# Patient Record
Sex: Male | Born: 1990 | Hispanic: Yes | Marital: Married | Smoking: Never smoker
Health system: Southern US, Community
[De-identification: ages and names within clinical notes are randomized; demographics above are authoritative.]

## PROBLEM LIST (undated history)

## (undated) DIAGNOSIS — J45909 Unspecified asthma, uncomplicated: Secondary | ICD-10-CM

---

## 2018-11-10 ENCOUNTER — Encounter (HOSPITAL_COMMUNITY): Payer: Self-pay

## 2018-11-10 ENCOUNTER — Ambulatory Visit (HOSPITAL_COMMUNITY)
Admission: EM | Admit: 2018-11-10 | Discharge: 2018-11-10 | Disposition: A | Discharge status: Home / Self Care | Payer: Self-pay | Attending: Emergency Medicine | Admitting: Emergency Medicine

## 2018-11-10 DIAGNOSIS — Z202 Contact with and (suspected) exposure to infections with a predominantly sexual mode of transmission: Secondary | ICD-10-CM | POA: Insufficient documentation

## 2018-11-10 DIAGNOSIS — R300 Dysuria: Secondary | ICD-10-CM | POA: Insufficient documentation

## 2018-11-10 DIAGNOSIS — Z7251 High risk heterosexual behavior: Secondary | ICD-10-CM | POA: Insufficient documentation

## 2018-11-10 DIAGNOSIS — R369 Urethral discharge, unspecified: Secondary | ICD-10-CM

## 2018-11-10 DIAGNOSIS — R319 Hematuria, unspecified: Secondary | ICD-10-CM

## 2018-11-10 LAB — POCT URINALYSIS DIP (DEVICE)
Glucose, UA: NEGATIVE mg/dL
Ketones, ur: NEGATIVE mg/dL
Leukocytes,Ua: NEGATIVE
Nitrite: NEGATIVE
Protein, ur: 100 mg/dL — AB
Specific Gravity, Urine: >=1.03 (ref 1.005–1.030)
Urobilinogen, UA: 0.2 mg/dL (ref 0.0–1.0)
pH: 5.5 (ref 5.0–8.0)

## 2018-11-10 MED ORDER — CEFTRIAXONE SODIUM 250 MG IJ SOLR
250.0000 mg | Freq: Once | INTRAMUSCULAR | Status: AC
  Administered 2018-11-10: 250 mg via INTRAMUSCULAR

## 2018-11-10 MED ORDER — CEFTRIAXONE SODIUM 250 MG IJ SOLR
INTRAMUSCULAR | Status: AC
  Filled 2018-11-10: qty 250

## 2018-11-10 MED ORDER — AZITHROMYCIN 250 MG PO TABS
1000.0000 mg | ORAL_TABLET | Freq: Once | ORAL | Status: AC
  Administered 2018-11-10: 1000 mg via ORAL

## 2018-11-10 MED ORDER — AZITHROMYCIN 250 MG PO TABS
ORAL_TABLET | ORAL | Status: AC
  Filled 2018-11-10: qty 4

## 2018-11-10 NOTE — ED Provider Notes (Signed)
[facility]    CSN: [id number] Arrival date & time: 11/10/18  2542     History   Chief Complaint Chief Complaint  Patient presents with  . Urinary Tract Infection    HPI Jesse Madden is a 28 y.o. male.   Patient presents with dysuria and hematuria x1 day.  He also reports clear penile discharge.  He denies testicular scrotal pain.  He denies fever, chills, abdominal pain, back pain.  He report he is sexually active with one partner.   The history is provided by the patient.    History reviewed. No pertinent past medical history.  There are no active problems to display for this patient.   History reviewed. No pertinent surgical history.     Home Medications    Prior to Admission medications   Not on File    Family History Family History  Problem Relation Age of Onset  . Asthma Mother   . Diabetes Mother   . Healthy Father     Social History Social History   Tobacco Use  . Smoking status: Never Smoker  . Smokeless tobacco: Never Used  Substance Use Topics  . Alcohol use: Yes  . Drug use: Not on file     Allergies   Patient has no known allergies.   Review of Systems Review of Systems  Constitutional: Negative for chills and fever.  HENT: Negative for ear pain and sore throat.   Eyes: Negative for pain and visual disturbance.  Respiratory: Negative for cough and shortness of breath.   Cardiovascular: Negative for chest pain and palpitations.  Gastrointestinal: Negative for abdominal pain and vomiting.  Genitourinary: Positive for discharge, dysuria and hematuria. Negative for flank pain and testicular pain.  Musculoskeletal: Negative for arthralgias and back pain.  Skin: Negative for color change and rash.  Neurological: Negative for seizures and syncope.  All other systems reviewed and are negative.    Physical Exam Triage Vital Signs ED Triage Vitals  Enc Vitals Group     BP 11/10/18 1926 (!) 143/93     Pulse Rate  11/10/18 1926 70     Resp 11/10/18 1926 17     Temp 11/10/18 1926 98 F (36.7 C)     Temp Source 11/10/18 1926 Oral     SpO2 11/10/18 1926 100 %     Weight --      Height --      Head Circumference --      Peak Flow --      Pain Score 11/10/18 1923 0     Pain Loc --      Pain Edu? --      Excl. in [place]? --    No data found.  Updated Vital Signs BP (!) 143/93 (BP Location: Right Arm)   Pulse 70   Temp 98 F (36.7 C) (Oral)   Resp 17   SpO2 100%   Visual Acuity Right Eye Distance:   Left Eye Distance:   Bilateral Distance:    Right Eye Near:   Left Eye Near:    Bilateral Near:     Physical Exam Vitals signs and nursing note reviewed.  Constitutional:      Appearance: He is well-developed.  HENT:     Head: Normocephalic and atraumatic.  Eyes:     Conjunctiva/sclera: Conjunctivae normal.  Neck:     Musculoskeletal: Neck supple.  Cardiovascular:     Rate and Rhythm: Normal rate and regular rhythm.  Heart sounds: No murmur.  Pulmonary:     Effort: Pulmonary effort is normal. No respiratory distress.     Breath sounds: Normal breath sounds.  Abdominal:     Palpations: Abdomen is soft.     Tenderness: There is no abdominal tenderness. There is no right CVA tenderness, left CVA tenderness, guarding or rebound.  Genitourinary:    Penis: Normal.      Scrotum/Testes: Normal.        Right: Tenderness not present.        Left: Tenderness not present.     Epididymis:     Right: Normal.     Left: Normal.     Comments: Clear penile discharge.  Skin:    General: Skin is warm and dry.  Neurological:     Mental Status: He is alert.      UC Treatments / Results  Labs (all labs ordered are listed, but only abnormal results are displayed) Labs Reviewed  POCT URINALYSIS DIP (DEVICE) - Abnormal; Notable for the following components:      Result Value   Bilirubin Urine SMALL (*)    Hgb urine dipstick LARGE (*)    Protein, ur 100 (*)    All other components within  normal limits  URINE CULTURE  URINE CYTOLOGY ANCILLARY ONLY    EKG   Radiology No results found.  Procedures Procedures (including critical care time)  Medications Ordered in UC Medications  azithromycin (ZITHROMAX) tablet 1,000 mg (has no administration in time range)  cefTRIAXone (ROCEPHIN) injection 250 mg (has no administration in time range)  azithromycin (ZITHROMAX) 250 MG tablet (has no administration in time range)  cefTRIAXone (ROCEPHIN) 250 MG injection (has no administration in time range)    Initial Impression / Assessment and Plan / UC Course  I have reviewed the triage vital signs and the nursing notes.  Pertinent labs & imaging results that were available during my care of the patient were reviewed by me and considered in my medical decision making (see chart for details).   Unprotected sex and potential exposure to STD.  Dysuria.  Treated today with Rocephin and Zithromax.  Urine culture sent.  Urine for gonorrhea, chlamydia, trichomonas also sent.  Discussed with patient that if his STD tests come back positive, his sexual partner will need to be treated.  Discussed that while his STD test are pending, he should use condoms or abstain from sex.  Discussed that he should return here or go to the emergency department if he develops testicular pain, scrotal pain, fever, chills, abdominal pain, back pain, or other concerning symptoms.     Final Clinical Impressions(s) / UC Diagnoses   Final diagnoses:  Unprotected sex  Potential exposure to STD  Dysuria     Discharge Instructions     Your urine has been sent for a culture.  Your STD tests are pending.    You were treated today with 2 antibiotics, Rocephin and Zithromax.    If your STD tests come back positive, we will call you.  Your sexual partners will need to be treated.    Return here or go to the emergency department if you develop testicular pain, scrotal pain, fever, chills, abdominal pain, back  pain, or other concerning symptoms.        ED Prescriptions    None     Controlled Substance Prescriptions [place] Controlled Substance Registry consulted? Not Applicable   Mickie Bailate, Fronnie Urton H, NP 11/10/18 2012

## 2018-11-10 NOTE — Discharge Instructions (Addendum)
Your urine has been sent for a culture.  Your STD tests are pending.    You were treated today with 2 antibiotics, Rocephin and Zithromax.    If your STD tests come back positive, we will call you.  Your sexual partners will need to be treated.    Return here or go to the emergency department if you develop testicular pain, scrotal pain, fever, chills, abdominal pain, back pain, or other concerning symptoms.

## 2018-11-10 NOTE — ED Triage Notes (Signed)
Patient presents to Urgent Care with complaints of burning with urination since earlier today. Patient reports he saw some blood in his urine several times today.

## 2018-11-11 LAB — URINE CULTURE: Culture: <10000 — AB

## 2018-11-14 ENCOUNTER — Ambulatory Visit (HOSPITAL_COMMUNITY)
Admission: EM | Admit: 2018-11-14 | Discharge: 2018-11-14 | Disposition: A | Discharge status: Home / Self Care | Payer: Self-pay | Attending: Family Medicine | Admitting: Family Medicine

## 2018-11-14 ENCOUNTER — Other Ambulatory Visit: Payer: Self-pay

## 2018-11-14 ENCOUNTER — Encounter (HOSPITAL_COMMUNITY): Payer: Self-pay | Admitting: Family Medicine

## 2018-11-14 DIAGNOSIS — M545 Low back pain, unspecified: Secondary | ICD-10-CM

## 2018-11-14 DIAGNOSIS — K5900 Constipation, unspecified: Secondary | ICD-10-CM

## 2018-11-14 HISTORY — DX: Unspecified asthma, uncomplicated: J45.909

## 2018-11-14 MED ORDER — CYCLOBENZAPRINE HCL 10 MG PO TABS: 5.0000 mg | ORAL_TABLET | Freq: Every day | ORAL | 0 refills | Status: AC

## 2018-11-14 MED ORDER — POLYETHYLENE GLYCOL 3350 17 GM/SCOOP PO POWD: 1.0000 | Freq: Once | ORAL | 0 refills | Status: AC

## 2018-11-14 MED ORDER — NAPROXEN 500 MG PO TABS: 500.0000 mg | ORAL_TABLET | Freq: Two times a day (BID) | ORAL | 0 refills | Status: AC

## 2018-11-14 NOTE — ED Provider Notes (Signed)
[facility]    CSN: [id number] Arrival date & time: 11/14/18  1038     History   Chief Complaint Chief Complaint  Patient presents with  . Back Pain    left lower  . Neck Pain    left    HPI Jesse Madden is a 28 y.o. male.   Patient is a 28 year old male who presents today for multiple complaints.  First complaint is left lateral neck pain and left lumbar pain that has been present, waxing waning over the past couple weeks.  Worsened upon wakening in the morning and better as the day goes on.  He has not been taking  anything for his pain.  He works in [employer] and does a lot of strenuous labor.  Denies any specific injuries or falls.  Denies any associated numbness, tingling, weakness in extremities or radiation of pain.  Denies any saddle paresthesias, loss of bowel or bladder function.  Denies any fevers.  He was seen here approximate 3 days ago and treated for STDs.  Reporting that his urinary symptoms have resolved.  He is also had some pressure in the rectal area and trouble with bowel movements.  Denies any blood in stool or pain with bowel movement. Reporting last BM this am.  No nausea, vomiting or diarrhea.  ROS per HPI      Past Medical History:  Diagnosis Date  . Asthma     There are no active problems to display for this patient.   History reviewed. No pertinent surgical history.     Home Medications    Prior to Admission medications   Medication Sig Start Date End Date Taking? Authorizing Provider  cyclobenzaprine (FLEXERIL) 10 MG tablet Take 0.5 tablets (5 mg total) by mouth at bedtime. 11/14/18   Dahlia ByesBast, Abdulmalik Darco A, NP  naproxen (NAPROSYN) 500 MG tablet Take 1 tablet (500 mg total) by mouth 2 (two) times daily. 11/14/18   Dahlia ByesBast, Cindel Daugherty A, NP  polyethylene glycol powder (GLYCOLAX/MIRALAX) 17 GM/SCOOP powder Take 255 g by mouth once for 1 dose. 11/14/18 11/14/18  Janace ArisBast, Diedra Sinor A, NP    Family History Family History  Problem Relation Age of Onset   . Asthma Mother   . Diabetes Mother   . Healthy Father     Social History Social History   Tobacco Use  . Smoking status: Never Smoker  . Smokeless tobacco: Never Used  Substance Use Topics  . Alcohol use: Yes  . Drug use: Yes    Frequency: 7.0 times per week    Types: Marijuana     Allergies   Penicillins   Review of Systems Review of Systems   Physical Exam Triage Vital Signs ED Triage Vitals  Enc Vitals Group     BP 11/14/18 1108 139/75     Pulse Rate 11/14/18 1108 64     Resp 11/14/18 1108 12     Temp 11/14/18 1108 98.1 F (36.7 C)     Temp Source 11/14/18 1108 Oral     SpO2 11/14/18 1108 100 %     Weight --      Height --      Head Circumference --      Peak Flow --      Pain Score 11/14/18 1104 4     Pain Loc --      Pain Edu? --      Excl. in [place]? --    No data found.  Updated Vital Signs BP 139/75 (BP  Location: Left Arm)   Pulse 64   Temp 98.1 F (36.7 C) (Oral)   Resp 12   SpO2 100%   Visual Acuity Right Eye Distance:   Left Eye Distance:   Bilateral Distance:    Right Eye Near:   Left Eye Near:    Bilateral Near:     Physical Exam Vitals signs and nursing note reviewed.  Constitutional:      Appearance: Normal appearance.  HENT:     Head: Normocephalic and atraumatic.     Nose: Nose normal.  Eyes:     Conjunctiva/sclera: Conjunctivae normal.  Neck:     Musculoskeletal: Normal range of motion and neck supple.      Comments: Good range of motion of the neck.  Mild tenderness to left cervical paravertebral musculature with rotation of the neck.  No bruising, swelling or deformities. Pulmonary:     Effort: Pulmonary effort is normal.  Abdominal:     Palpations: Abdomen is soft.     Tenderness: There is no abdominal tenderness.  Musculoskeletal: Normal range of motion.       Back:     Comments: Mildly tender to palpation of the left lower lumbar paravertebral musculature. No bruising, swelling or deformities.  Skin:     General: Skin is warm and dry.  Neurological:     Mental Status: He is alert.  Psychiatric:        Mood and Affect: Mood normal.      UC Treatments / Results  Labs (all labs ordered are listed, but only abnormal results are displayed) Labs Reviewed - No data to display  EKG   Radiology No results found.  Procedures Procedures (including critical care time)  Medications Ordered in UC Medications - No data to display  Initial Impression / Assessment and Plan / UC Course  I have reviewed the triage vital signs and the nursing notes.  Pertinent labs & imaging results that were available during my care of the patient were reviewed by me and considered in my medical decision making (see chart for details).     Muscle strain-treating with naproxen twice a day for pain inflammation.  Low-dose muscle relaxant to take at nighttime. Gentle stretching, heat, massage  Constipation- Most likely rectal pressure is related to constipation.  Reporting problems with bowel movements.  Denies any rectal bleeding. Treating this with MiraLAX  Patient requesting x-ray today but do not believe this is warranted. No spinal tenderness or concerning signs. No injuries or falls.  Instructed to follow-up for any continued or worsening problems Final Clinical Impressions(s) / UC Diagnoses   Final diagnoses:  Acute left-sided low back pain without sciatica  Constipation, unspecified constipation type     Discharge Instructions     Treating you for muscle strain Naproxen as needed twice a day for pain inflammation.  Make sure you take this with food. Flexeril for muscle relaxant to use at bedtime Gentle stretching, heat or massage could help. MiraLAX for constipation.  You can take this twice a day and to get a good bowel movement.  Make sure you take with a full glass of water. Follow up as needed for continued or worsening symptoms     ED Prescriptions    Medication Sig Dispense Auth.  Provider   cyclobenzaprine (FLEXERIL) 10 MG tablet Take 0.5 tablets (5 mg total) by mouth at bedtime. 20 tablet Lashawn Orrego A, NP   naproxen (NAPROSYN) 500 MG tablet Take 1 tablet (500 mg total) by  mouth 2 (two) times daily. 30 tablet Shawndale Kilpatrick A, NP   polyethylene glycol powder (GLYCOLAX/MIRALAX) 17 GM/SCOOP powder Take 255 g by mouth once for 1 dose. 255 g Dahlia ByesBast, Tempie Gibeault A, NP     Controlled Substance Prescriptions [place] Controlled Substance Registry consulted? Not Applicable   Janace ArisBast, Kahlin Mark A, NP 11/14/18 1137

## 2018-11-14 NOTE — Discharge Instructions (Signed)
Treating you for muscle strain Naproxen as needed twice a day for pain inflammation.  Make sure you take this with food. Flexeril for muscle relaxant to use at bedtime Gentle stretching, heat or massage could help. MiraLAX for constipation.  You can take this twice a day and to get a good bowel movement.  Make sure you take with a full glass of water. Follow up as needed for continued or worsening symptoms

## 2018-11-14 NOTE — ED Triage Notes (Signed)
Pt reports left lower back pain and left neck pain x3+ weeks.  He denies any injuyr, fall, or MVC.  Pt reports pain near the rectum when he urinates or has a BM.  He has taken no OTC medications for this.  He was seen here 4 days ago.

## 2018-11-15 LAB — URINE CYTOLOGY ANCILLARY ONLY
Chlamydia: POSITIVE — AB
Neisseria Gonorrhea: NEGATIVE
Trichomonas: NEGATIVE

## 2018-11-16 ENCOUNTER — Telehealth (HOSPITAL_COMMUNITY): Payer: Self-pay | Admitting: Emergency Medicine

## 2018-11-16 NOTE — Telephone Encounter (Signed)
Chlamydia is positive.  This was treated at the urgent care visit with po zithromax 1g.  Pt needs education to please refrain from sexual intercourse for 7 days to give the medicine time to work.  Sexual partners need to be notified and tested/treated.  Condoms may reduce risk of reinfection.  Recheck or followup with PCP for further evaluation if symptoms are not improving.  [facility] notified.  Patient contacted and made aware of all results, all questions answered. Pt states he is having continued symptoms with burning with urination, and tingling. Pt will need to follow up with PCP or come in for a recheck.

## 2018-11-24 ENCOUNTER — Emergency Department (HOSPITAL_COMMUNITY)
Admission: EM | Admit: 2018-11-24 | Discharge: 2018-11-24 | Disposition: A | Discharge status: Home / Self Care | Payer: Self-pay | Attending: Emergency Medicine | Admitting: Emergency Medicine

## 2018-11-24 ENCOUNTER — Emergency Department (HOSPITAL_COMMUNITY): Payer: Self-pay

## 2018-11-24 ENCOUNTER — Other Ambulatory Visit: Payer: Self-pay

## 2018-11-24 ENCOUNTER — Encounter (HOSPITAL_COMMUNITY): Payer: Self-pay | Admitting: *Deleted

## 2018-11-24 DIAGNOSIS — J45909 Unspecified asthma, uncomplicated: Secondary | ICD-10-CM | POA: Insufficient documentation

## 2018-11-24 DIAGNOSIS — M545 Low back pain, unspecified: Secondary | ICD-10-CM

## 2018-11-24 DIAGNOSIS — M25571 Pain in right ankle and joints of right foot: Secondary | ICD-10-CM | POA: Insufficient documentation

## 2018-11-24 DIAGNOSIS — K5900 Constipation, unspecified: Secondary | ICD-10-CM | POA: Insufficient documentation

## 2018-11-24 DIAGNOSIS — M542 Cervicalgia: Secondary | ICD-10-CM | POA: Insufficient documentation

## 2018-11-24 LAB — COMPREHENSIVE METABOLIC PANEL
ALT: 18 U/L (ref 0–44)
AST: 17 U/L (ref 15–41)
Albumin: 3.6 g/dL (ref 3.5–5.0)
Alkaline Phosphatase: 85 U/L (ref 38–126)
Anion gap: 8 (ref 5–15)
BUN: 7 mg/dL (ref 6–20)
CO2: 28 mmol/L (ref 22–32)
Calcium: 8.8 mg/dL — ABNORMAL LOW (ref 8.9–10.3)
Chloride: 102 mmol/L (ref 98–111)
Creatinine, Ser: 0.67 mg/dL (ref 0.61–1.24)
GFR calc Af Amer: >60 mL/min (ref >60)
GFR calc non Af Amer: >60 mL/min (ref >60)
Glucose, Bld: 99 mg/dL (ref 70–99)
Potassium: 4.2 mmol/L (ref 3.5–5.1)
Sodium: 138 mmol/L (ref 135–145)
Total Bilirubin: 0.5 mg/dL (ref 0.3–1.2)
Total Protein: 7.1 g/dL (ref 6.5–8.1)

## 2018-11-24 LAB — URINALYSIS, ROUTINE W REFLEX MICROSCOPIC
Bacteria, UA: NONE SEEN
Bilirubin Urine: NEGATIVE
Glucose, UA: NEGATIVE mg/dL
Hgb urine dipstick: NEGATIVE
Ketones, ur: NEGATIVE mg/dL
Leukocytes,Ua: NEGATIVE
Nitrite: NEGATIVE
Protein, ur: 30 mg/dL — AB
Specific Gravity, Urine: 1.02 (ref 1.005–1.030)
pH: 8 (ref 5.0–8.0)

## 2018-11-24 LAB — CBC WITH DIFFERENTIAL/PLATELET
Abs Immature Granulocytes: 0.04 10*3/uL (ref 0.00–0.07)
Basophils Absolute: 0.1 10*3/uL (ref 0.0–0.1)
Basophils Relative: 1 %
Eosinophils Absolute: 0.2 10*3/uL (ref 0.0–0.5)
Eosinophils Relative: 3 %
HCT: 41.5 % (ref 39.0–52.0)
Hemoglobin: 13.8 g/dL (ref 13.0–17.0)
Immature Granulocytes: 1 %
Lymphocytes Relative: 25 %
Lymphs Abs: 1.9 10*3/uL (ref 0.7–4.0)
MCH: 30.3 pg (ref 26.0–34.0)
MCHC: 33.3 g/dL (ref 30.0–36.0)
MCV: 91 fL (ref 80.0–100.0)
Monocytes Absolute: 0.8 10*3/uL (ref 0.1–1.0)
Monocytes Relative: 10 %
Neutro Abs: 4.6 10*3/uL (ref 1.7–7.7)
Neutrophils Relative %: 60 %
Platelets: 295 10*3/uL (ref 150–400)
RBC: 4.56 MIL/uL (ref 4.22–5.81)
RDW: 12.4 % (ref 11.5–15.5)
WBC: 7.6 10*3/uL (ref 4.0–10.5)
nRBC: 0 % (ref 0.0–0.2)

## 2018-11-24 MED ORDER — CEPHALEXIN 250 MG PO CAPS
500.0000 mg | ORAL_CAPSULE | Freq: Once | ORAL | Status: AC
  Administered 2018-11-24: 23:00:00 500 mg via ORAL
  Filled 2018-11-24: qty 2

## 2018-11-24 MED ORDER — LIDOCAINE 5 % EX PTCH: 1.0000 | MEDICATED_PATCH | CUTANEOUS | 0 refills | Status: AC

## 2018-11-24 MED ORDER — CEPHALEXIN 500 MG PO CAPS: 500.0000 mg | ORAL_CAPSULE | Freq: Four times a day (QID) | ORAL | 0 refills | Status: AC

## 2018-11-24 MED ORDER — HYDROCODONE-ACETAMINOPHEN 5-325 MG PO TABS
1.0000 | ORAL_TABLET | Freq: Once | ORAL | Status: AC
  Administered 2018-11-24: 1 via ORAL
  Filled 2018-11-24: qty 1

## 2018-11-24 NOTE — ED Provider Notes (Signed)
[facility]   CSN: [id number] Arrival date & time: 11/24/18  1832     History   Chief Complaint Chief Complaint  Patient presents with   Back Pain   Joint Pain    HPI Jesse Madden is a 28 y.o. male presenting for evaluation of left low back pain and right ankle pain.  Patient states the past 3 weeks, he has been having left low back pain.  It is worse with movement and when he first wakes up in the morning.  He also states he was having left-sided neck pain, although this is improved.  When his symptoms first began 3 weeks ago, he was having hematuria, this has resolved.  He reports some difficulty having bowel movements over the past several weeks, but was able to have a bowel movement today.  He denies fall, trauma, or injury.  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  He was seen at urgent care twice for his symptoms, given naproxen and a muscle relaxer which he took for 2 days without improvement and then stopped.  He is not using anything for his pain currently.  Patient states in the past week to 2, he developed pain and swelling of his right ankle.  He denies fall, trauma, or injury.  He denies numbness or tingling.  He denies fevers, chills, chest pain, shortness of breath, nausea, vomiting, anterior abdominal pain, dysuria, urinary frequency, blood in his stool.  He denies history of cancer, IVDU, or loss of bowel bladder control.  States he has no medical problems, takes no medications daily.  Additional history obtained from chart review.  Patient tested positive for chlamydia when symptoms first began, he was treated for this. Seen again for back pain, given naproxen, flexeril and miralax for his sxs.      HPI  Past Medical History:  Diagnosis Date   Asthma     There are no active problems to display for this patient.   History reviewed. No pertinent surgical history.      Home Medications    Prior  to Admission medications   Medication Sig Start Date End Date Taking? Authorizing Provider  cephALEXin (KEFLEX) 500 MG capsule Take 1 capsule (500 mg total) by mouth 4 (four) times daily for 7 days. 11/24/18 12/01/18  Lynsie Mcwatters, PA-C  cyclobenzaprine (FLEXERIL) 10 MG tablet Take 0.5 tablets (5 mg total) by mouth at bedtime. 11/14/18   Bast, Gloris Manchesterraci A, NP  lidocaine (LIDODERM) 5 % Place 1 patch onto the skin daily. Remove & Discard patch within 12 hours or as directed by MD 11/24/18   Maalle Starrett, PA-C  naproxen (NAPROSYN) 500 MG tablet Take 1 tablet (500 mg total) by mouth 2 (two) times daily. 11/14/18   Janace ArisBast, Traci A, NP    Family History Family History  Problem Relation Age of Onset   Asthma Mother    Diabetes Mother    Healthy Father     Social History Social History   Tobacco Use   Smoking status: Never Smoker   Smokeless tobacco: Never Used  Substance Use Topics   Alcohol use: Yes    Comment: OCC   Drug use: Yes    Frequency: 7.0 times per week    Types: Marijuana     Allergies   Penicillins   Review of Systems Review of Systems  Genitourinary: Positive for hematuria (resolved).  Musculoskeletal: Positive for arthralgias, back pain and joint swelling.  All  other systems reviewed and are negative.    Physical Exam Updated Vital Signs BP 129/83 (BP Location: Right Arm)    Pulse 75    Temp 98.9 F (37.2 C) (Oral)    Resp 18    Ht 5\' 4"  (1.626 m)    Wt 80.7 kg    SpO2 100%    BMI 30.55 kg/m   Physical Exam Vitals signs and nursing note reviewed.  Constitutional:      General: He is not in acute distress.    Appearance: He is well-developed.     Comments: Appears nontoxic  HENT:     Head: Normocephalic and atraumatic.  Eyes:     Extraocular Movements: Extraocular movements intact.     Conjunctiva/sclera: Conjunctivae normal.     Pupils: Pupils are equal, round, and reactive to light.  Neck:     Musculoskeletal: Normal range of motion and neck  supple.     Comments: Moving head easily without signs of pain Cardiovascular:     Rate and Rhythm: Normal rate and regular rhythm.     Pulses: Normal pulses.  Pulmonary:     Effort: Pulmonary effort is normal. No respiratory distress.     Breath sounds: Normal breath sounds. No wheezing.  Abdominal:     General: There is no distension.     Palpations: Abdomen is soft. There is no mass.     Tenderness: There is no abdominal tenderness. There is no guarding or rebound.     Comments: No tenderness palpation the abdomen.  Soft without rigidity, guarding, distention.  Negative rebound.  Musculoskeletal:        General: Swelling and tenderness present.     Comments: Tenderness palpation over left low back musculature.  No pain over midline spine.  No step-offs or deformities.  No pain with straight leg raise.  Strength of lower extremities intact bilaterally.  Sensation intact.  No saddle paresthesias. Mild swelling over the lateral malleolus of the right ankle.  Mild erythema over the lateral malleolus.  Pedal pulses intact.  No tenderness palpation of the calf.  Achilles tendon palpable and intact.  Pain with passive range of motion, but patient able to range.  Skin:    General: Skin is warm and dry.     Capillary Refill: Capillary refill takes less than 2 seconds.  Neurological:     Mental Status: He is alert and oriented to person, place, and time.          ED Treatments / Results  Labs (all labs ordered are listed, but only abnormal results are displayed) Labs Reviewed  COMPREHENSIVE METABOLIC PANEL - Abnormal; Notable for the following components:      Result Value   Calcium 8.8 (*)    All other components within normal limits  URINALYSIS, ROUTINE W REFLEX MICROSCOPIC - Abnormal; Notable for the following components:   Protein, ur 30 (*)    All other components within normal limits  CBC WITH DIFFERENTIAL/PLATELET  HIV ANTIBODY (ROUTINE TESTING W REFLEX)     EKG None  Radiology Dg Ankle Complete Right  Result Date: 11/24/2018 CLINICAL DATA:  Pain and swelling EXAM: RIGHT ANKLE - COMPLETE 3+ VIEW COMPARISON:  None. FINDINGS: Frontal, oblique, and lateral views were obtained. There is soft tissue swelling laterally. No fracture evident. There is a joint effusion. There is no joint space narrowing or erosive change. Ankle mortise appears intact. IMPRESSION: Soft tissue swelling with joint effusion. Question ligamentous injury. No fracture evident. No appreciable  joint space narrowing or erosion. Ankle mortise appears intact. Electronically Signed   By: Lowella Grip III M.D.   On: 11/24/2018 19:45   Ct Renal Stone Study  Result Date: 11/24/2018 CLINICAL DATA:  Left flank pain EXAM: CT ABDOMEN AND PELVIS WITHOUT CONTRAST TECHNIQUE: Multidetector CT imaging of the abdomen and pelvis was performed following the standard protocol without IV contrast. COMPARISON:  None. FINDINGS: Lower chest: The visualized heart size within normal limits. No pericardial fluid/thickening. No hiatal hernia. The visualized portions of the lungs are clear. Hepatobiliary: Although limited due to the lack of intravenous contrast, normal in appearance without gross focal abnormality. No evidence of calcified gallstones or biliary ductal dilatation. Pancreas:  Unremarkable.  No surrounding inflammatory changes. Spleen: Normal in size. Although limited due to the lack of intravenous contrast, normal in appearance. Adrenals/Urinary Tract: Both adrenal glands appear normal. The kidneys and collecting system appear normal without evidence of urinary tract calculus or hydronephrosis. Bladder is unremarkable. Stomach/Bowel: The stomach, small bowel, and colon are normal in appearance. There is a moderate amount of colonic stool. No inflammatory changes or obstructive findings. appendix is normal. Vascular/Lymphatic: There are no enlarged abdominal or pelvic lymph nodes. No significant  gross vascular findings are present. Reproductive: The prostate is unremarkable. 01 Other: No evidence of abdominal wall mass or hernia. Musculoskeletal: No acute or significant osseous findings. IMPRESSION: 1. No renal or collecting system calculi. 2. Moderate amount of colonic stool without evidence of obstruction. Electronically Signed   By: Prudencio Pair M.D.   On: 11/24/2018 20:56    Procedures Procedures (including critical care time)  Medications Ordered in ED Medications  HYDROcodone-acetaminophen (NORCO/VICODIN) 5-325 MG per tablet 1 tablet (1 tablet Oral Given 11/24/18 2311)  cephALEXin (KEFLEX) capsule 500 mg (500 mg Oral Given 11/24/18 2311)     Initial Impression / Assessment and Plan / ED Course  I have reviewed the triage vital signs and the nursing notes.  Pertinent labs & imaging results that were available during my care of the patient were reviewed by me and considered in my medical decision making (see chart for details).        Patient presenting for evaluation of continued back pain and right ankle pain.  Physical exam the patient appears nontoxic.  No red flags for back pain.  Pain is reproducible with palpation of musculature, patient works in a very physical job.  Likely MSK pain.  However, this is his second visit for his back pain, will obtain imaging to ensure no kidney stone or obvious infection.  Will obtain labs to assess kidneys.  Urine for further evaluation.  Patient also with pain and swelling of his right lateral malleolus.  Will obtain x-rays. Case discussed with attending, Dr. Venora Maples agrees to plan.   Labs reassuring, no leukocytosis.  Kidney and liver function reassuring.  UA negative for infection.  X-ray viewed interpreted by me, no fracture or dislocation.  Per radiology read, consider ligamentous injury.  However, on exam higher suspicion for early cellulitis considering erythema and warmth and lack of injury.  Will treat with Keflex.  CT renal stone  shows constipation, otherwise negative.  Discussed findings with patient.  Discussed symptomatic treatment of his back pain with NSAIDs and muscle relaxers as prescribed.  Will give Lidoderm patches.  Encourage MiraLAX use.  Encouraged hydration.  At this time, patient appears safe for discharge.  Return precautions given.  Patient states he understands and agrees to plan.  Final Clinical Impressions(s) / ED  Diagnoses   Final diagnoses:  Acute right ankle pain  Left-sided low back pain without sciatica, unspecified chronicity  Constipation, unspecified constipation type    ED Discharge Orders         Ordered    cephALEXin (KEFLEX) 500 MG capsule  4 times daily     11/24/18 2305    lidocaine (LIDODERM) 5 %  Every 24 hours     11/24/18 2305           Alveria ApleyCaccavale, Jenner Rosier, PA-C 11/24/18 2346    Azalia Bilisampos, Kevin, MD 11/25/18 2243

## 2018-11-24 NOTE — ED Triage Notes (Signed)
Pt has been seen twice for L neck pain, L flank pain, R ankle swelling and pain x 3 weeks.  States pain is increasing and he was unable to sleep last night.  States initially experienced hematuria, but now urine is clear.

## 2018-11-24 NOTE — Discharge Instructions (Addendum)
Take naproxen 2 times a day with meals.  Do not take other anti-inflammatories at the same time (Advil, Motrin, ibuprofen, Aleve). You may supplement with Tylenol if you need further pain control. Use the muscle as needed for muscle stiffness or soreness.  Use the lidocaine patches on your back to help with pain.  Do the stretches in the paperwork for pain. Take keflex to treat the potential infection of the skin of your ankle.  Make sure you stay hydrated with water.  Take miralax as needed for constipation.  Return to the ER if you develop high fevers, inability to urinate, or with any new, worsening, or concerning symptoms.

## 2018-11-25 LAB — HIV ANTIBODY (ROUTINE TESTING W REFLEX): HIV Screen 4th Generation wRfx: NONREACTIVE

## 2021-02-28 IMAGING — CT CT RENAL STONE PROTOCOL
2 of 4 series · 16 of 46 positions shown, 18 images · non-contrast
Comparison: None.

CLINICAL DATA: Left flank pain

EXAM:
CT ABDOMEN AND PELVIS WITHOUT CONTRAST
TECHNIQUE: Multidetector CT imaging of the abdomen and pelvis was performed
following the standard protocol without IV contrast.

[Series 4: ap without · axial · non-contrast · 0.64mm/px · z∈[+814,+1169]mm · 13 of 79 slices shown, 15 images]
[im 4/79  soft-tissue]
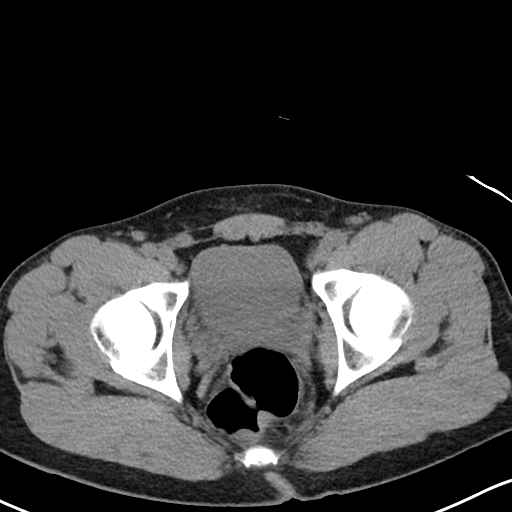
[im 4/79  bone]
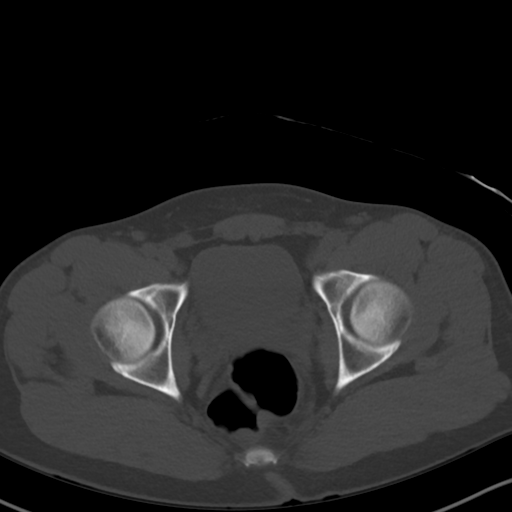
[im 12/79  soft-tissue]
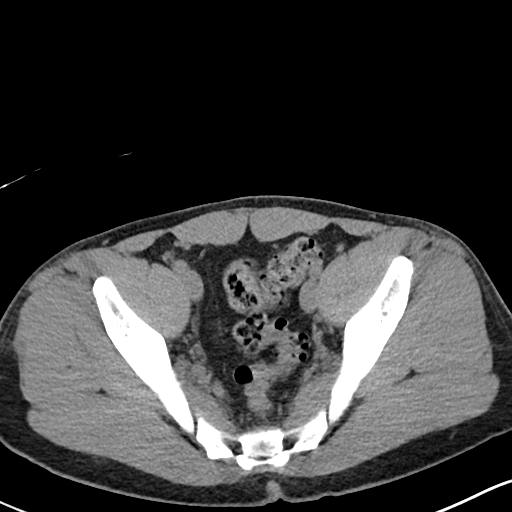
[im 16/79  soft-tissue]
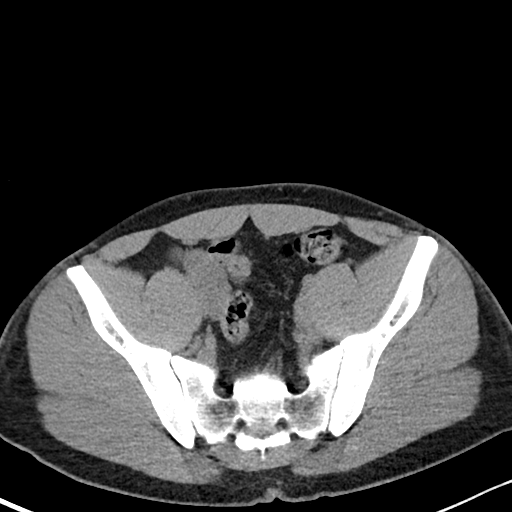
[im 24/79  soft-tissue]
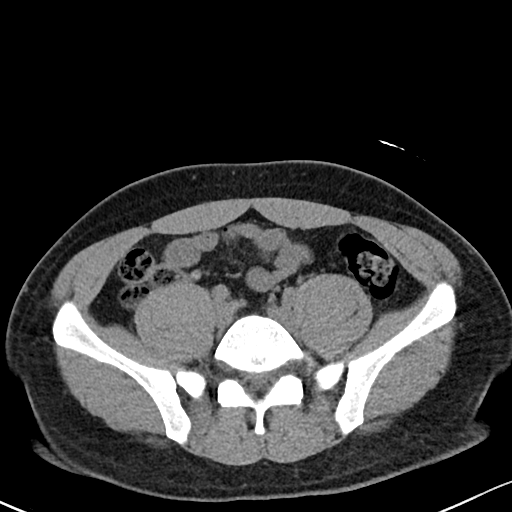
[im 28/79  soft-tissue]
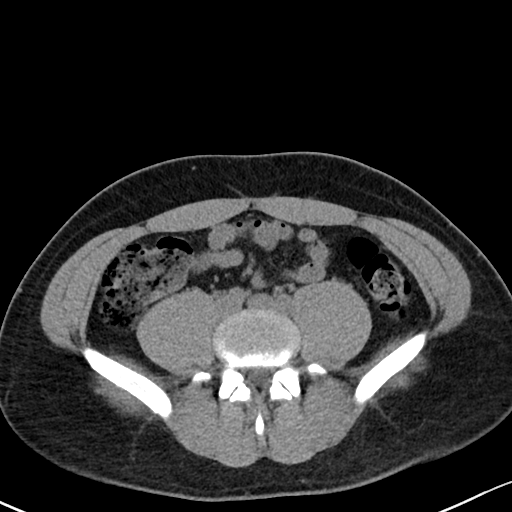
[im 36/79  soft-tissue]
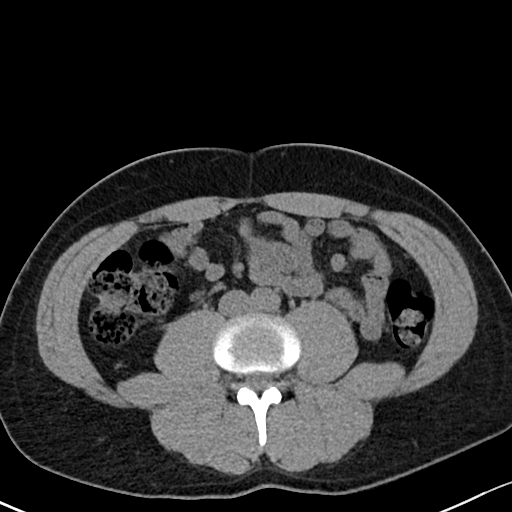
[im 40/79  soft-tissue]
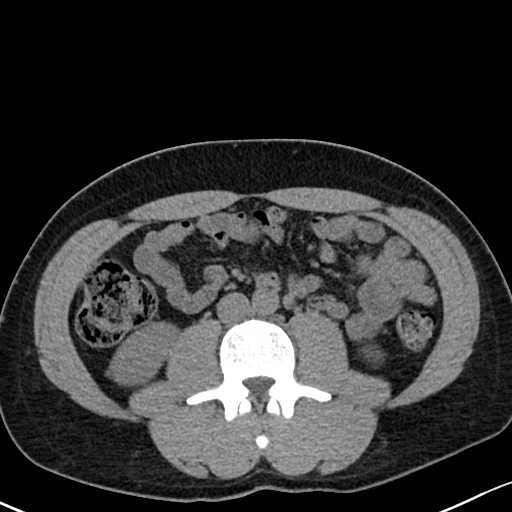
[im 43/79  soft-tissue]
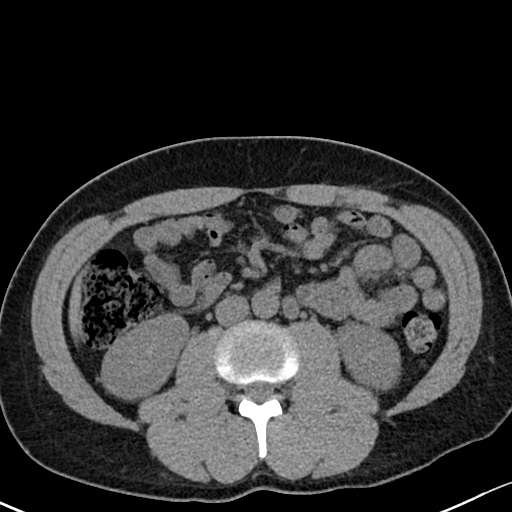
[im 51/79  soft-tissue]
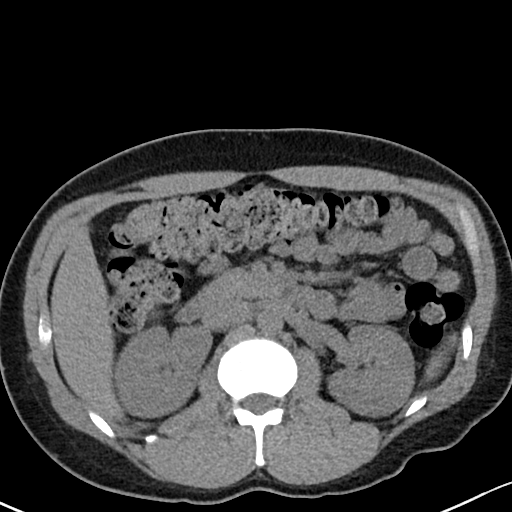
[im 51/79  bone]
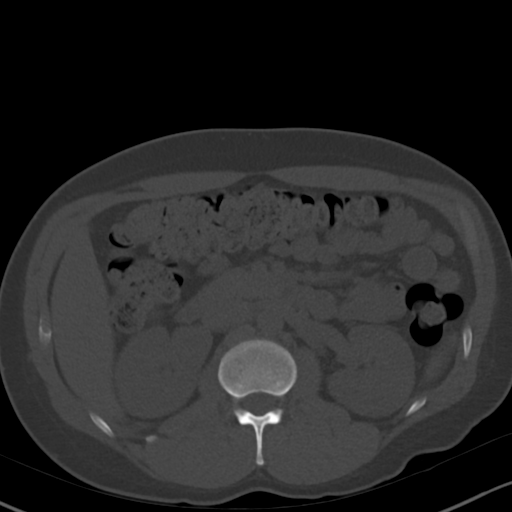
[im 55/79  soft-tissue]
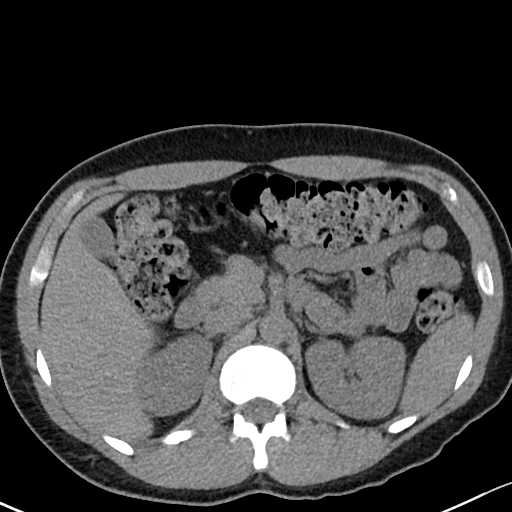
[im 63/79  soft-tissue]
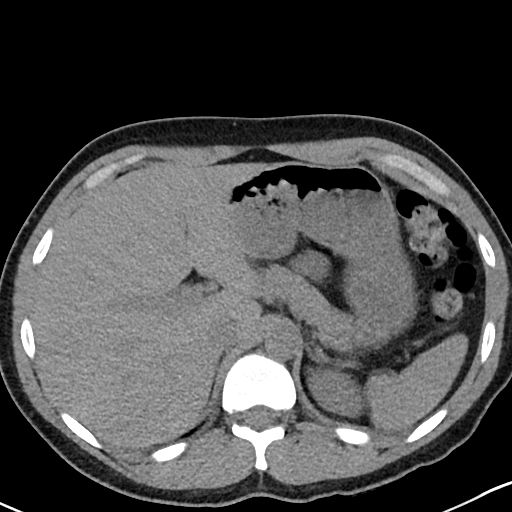
[im 67/79  soft-tissue]
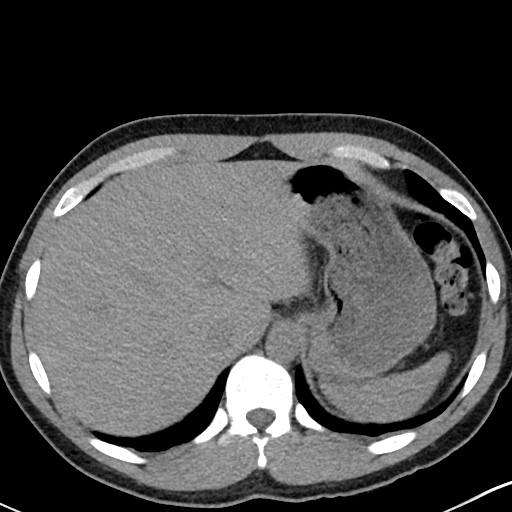
[im 75/79  soft-tissue]
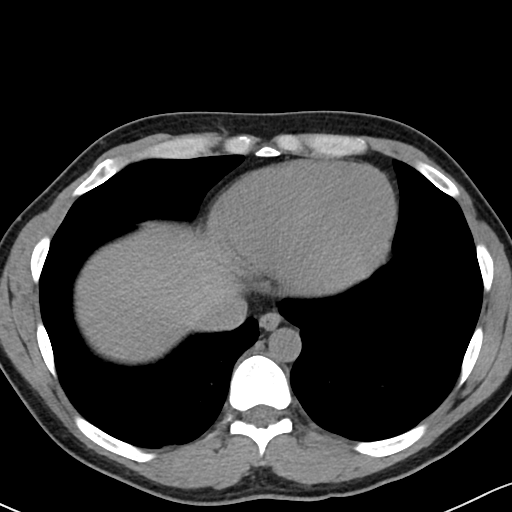

[Series 7: cor · coronal · 0.79mm/px · 3 of 101 slices shown]
[im 34/101  soft-tissue]
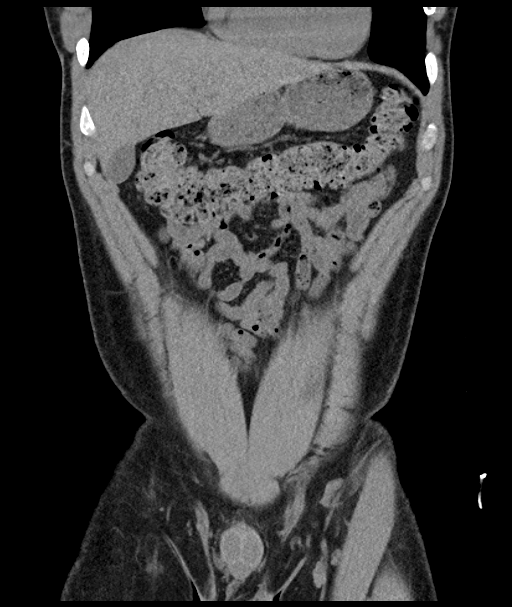
[im 45/101  soft-tissue]
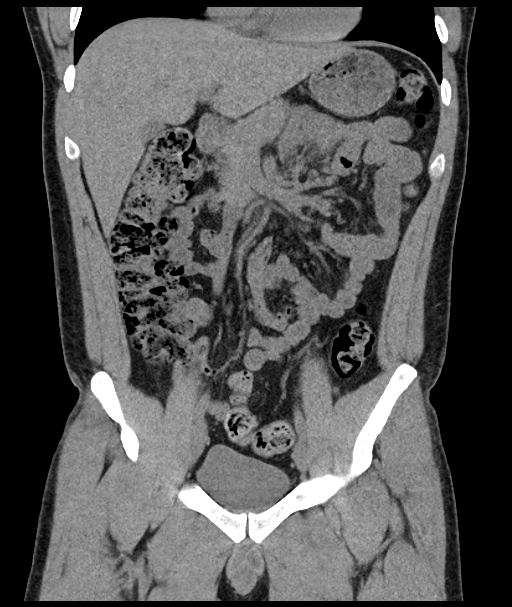
[im 56/101  soft-tissue]
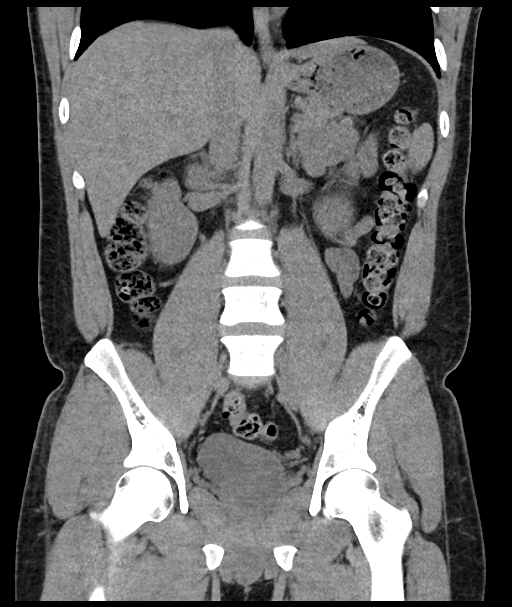

[16 of 46 positions shown; findings below may reference images not displayed]

FINDINGS: Lower chest: The visualized heart size within normal limits. No
pericardial fluid/thickening.

No hiatal hernia.

The visualized portions of the lungs are clear.

Hepatobiliary: Although limited due to the lack of intravenous
contrast, normal in appearance without gross focal abnormality. No
evidence of calcified gallstones or biliary ductal dilatation.

Pancreas:  Unremarkable.  No surrounding inflammatory changes.

Spleen: Normal in size. Although limited due to the lack of
intravenous contrast, normal in appearance.

Adrenals/Urinary Tract: Both adrenal glands appear normal. The
kidneys and collecting system appear normal without evidence of
urinary tract calculus or hydronephrosis. Bladder is unremarkable.

Stomach/Bowel: The stomach, small bowel, and colon are normal in
appearance. There is a moderate amount of colonic stool. No
inflammatory changes or obstructive findings. appendix is normal.

Vascular/Lymphatic: There are no enlarged abdominal or pelvic lymph
nodes. No significant gross vascular findings are present.

Reproductive: The prostate is unremarkable. 01

Other: No evidence of abdominal wall mass or hernia.

Musculoskeletal: No acute or significant osseous findings.
IMPRESSION: 1. No renal or collecting system calculi.
2. Moderate amount of colonic stool without evidence of obstruction.
